# Patient Record
Sex: Female | Born: 2005 | Race: White | Hispanic: No | Smoking: Never smoker
Health system: Southern US, Community
[De-identification: ages and names within clinical notes are randomized; demographics above are authoritative.]

---
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
## 2023-01-09 ENCOUNTER — Other Ambulatory Visit (HOSPITAL_BASED_OUTPATIENT_CLINIC_OR_DEPARTMENT_OTHER): Payer: Self-pay

## 2023-01-09 ENCOUNTER — Other Ambulatory Visit: Payer: Self-pay

## 2023-01-09 ENCOUNTER — Other Ambulatory Visit (HOSPITAL_COMMUNITY): Payer: Self-pay

## 2023-01-09 MED ORDER — PANTOPRAZOLE SODIUM 20 MG PO TBEC: 20.0000 mg | DELAYED_RELEASE_TABLET | Freq: Every day | ORAL | 1 refills | Status: DC

## 2023-01-09 MED ORDER — PHENTERMINE HCL 37.5 MG PO TABS
37.5000 mg | ORAL_TABLET | Freq: Every day | ORAL | 0 refills | Status: DC
  Filled 2023-01-09: qty 30, 30d supply, fill #0

## 2023-01-10 ENCOUNTER — Other Ambulatory Visit (HOSPITAL_COMMUNITY): Payer: Self-pay

## 2023-01-10 MED ORDER — PHENTERMINE HCL 37.5 MG PO TABS: 18.7500 mg | ORAL_TABLET | Freq: Every day | ORAL | 0 refills | Status: DC

## 2023-01-21 ENCOUNTER — Other Ambulatory Visit (HOSPITAL_COMMUNITY): Payer: Self-pay

## 2023-03-19 ENCOUNTER — Other Ambulatory Visit (HOSPITAL_COMMUNITY): Payer: Self-pay

## 2023-03-19 MED ORDER — PHENTERMINE HCL 37.5 MG PO TABS
18.7500 mg | ORAL_TABLET | Freq: Every day | ORAL | 0 refills | Status: DC
Start: 2023-03-19 — End: 2023-08-20
  Filled 2023-03-19: qty 30, 30d supply, fill #0

## 2023-03-20 ENCOUNTER — Other Ambulatory Visit (HOSPITAL_COMMUNITY): Payer: Self-pay

## 2023-03-25 ENCOUNTER — Other Ambulatory Visit (HOSPITAL_COMMUNITY): Payer: Self-pay

## 2023-04-12 ENCOUNTER — Other Ambulatory Visit: Payer: Self-pay

## 2023-04-12 ENCOUNTER — Encounter (HOSPITAL_BASED_OUTPATIENT_CLINIC_OR_DEPARTMENT_OTHER): Payer: Self-pay | Admitting: Emergency Medicine

## 2023-04-12 ENCOUNTER — Other Ambulatory Visit (HOSPITAL_COMMUNITY): Payer: Self-pay

## 2023-04-12 ENCOUNTER — Emergency Department (HOSPITAL_BASED_OUTPATIENT_CLINIC_OR_DEPARTMENT_OTHER)
Admission: EM | Admit: 2023-04-12 | Discharge: 2023-04-12 | Disposition: A | Discharge status: Home / Self Care | Attending: Emergency Medicine | Admitting: Emergency Medicine

## 2023-04-12 DIAGNOSIS — R509 Fever, unspecified: Secondary | ICD-10-CM | POA: Insufficient documentation

## 2023-04-12 DIAGNOSIS — J029 Acute pharyngitis, unspecified: Secondary | ICD-10-CM | POA: Diagnosis present

## 2023-04-12 LAB — RESP PANEL BY RT-PCR (RSV, FLU A&B, COVID)  RVPGX2
Influenza A by PCR: NEGATIVE
Influenza B by PCR: NEGATIVE
Resp Syncytial Virus by PCR: NEGATIVE
SARS Coronavirus 2 by RT PCR: NEGATIVE

## 2023-04-12 MED ORDER — AZITHROMYCIN 250 MG PO TABS
ORAL_TABLET | ORAL | 0 refills | Status: DC
Start: 2023-04-12 — End: 2023-04-12
  Filled 2023-04-12: qty 6, 5d supply, fill #0

## 2023-04-12 MED ORDER — PSEUDOEPH-BROMPHEN-DM 30-2-10 MG/5ML PO SYRP
ORAL_SOLUTION | ORAL | 0 refills | Status: DC
  Filled 2023-04-12: qty 240, 4d supply, fill #0

## 2023-04-12 MED ORDER — DEXAMETHASONE SODIUM PHOSPHATE 10 MG/ML IJ SOLN
10.0000 mg | Freq: Once | INTRAMUSCULAR | Status: AC
Start: 2023-04-12 — End: 2023-04-12
  Administered 2023-04-12: 10 mg via INTRAMUSCULAR
  Filled 2023-04-12: qty 1

## 2023-04-12 MED ORDER — PREDNISONE 20 MG PO TABS
ORAL_TABLET | ORAL | 0 refills | Status: DC
Start: 2023-04-12 — End: 2023-04-12
  Filled 2023-04-12: qty 5, 5d supply, fill #0

## 2023-04-12 NOTE — ED Triage Notes (Addendum)
 Fever last night , sore throat , earache , body aches , headache .  Was tested yesterday for strep and mono , negative results .

## 2023-04-12 NOTE — ED Provider Notes (Signed)
 [facility] Provider Note   CSN: [id number] Arrival date & time: 04/12/23  1620     History Chief Complaint  Patient presents with   Fever    Diane Benton is a 18 y.o. female.  Patient presents to the emergency department today with concerns of a sore throat, fever, earache and bodyaches.  She states that she was tested for strep and mono yesterday at her [facility] and was negative.  She is currently a Consulting civil engineer at [employer].  She reports that she is currently sexually active.  Denies any urinary concerns. Reports performing oral sex within the last 2 weeks with some condom use and that this partner is a "sort of newish" partner.   Fever      Home Medications Prior to Admission medications   Medication Sig Start Date End Date Taking? Authorizing Provider  escitalopram (LEXAPRO) 10 MG tablet Take 1 tablet (10 mg total) by mouth daily. 11/05/22     fluticasone (FLONASE) 50 MCG/ACT nasal spray Place 2 sprays into both nostrils daily. 11/09/22   Olive Bass, FNP  ondansetron (ZOFRAN) 4 MG tablet Take 1 tablet (4 mg total) by mouth every 8 (eight) hours as needed for nausea or vomiting. 03/06/22   Olive Bass, FNP  ondansetron (ZOFRAN) 4 MG tablet Take 1 tablet (4 mg total) by mouth every 6 (six) hours as needed. 03/07/22     ondansetron (ZOFRAN) 4 MG tablet Take 1 tablet (4 mg total) by mouth every 6 (six) hours as needed. 05/03/22     ondansetron (ZOFRAN) 4 MG tablet Take 1 tablet by mouth every 6 hours as needed. 10/24/22     pantoprazole (PROTONIX) 20 MG tablet Take 1 tablet (20 mg total) by mouth daily. 09/14/22     pantoprazole (PROTONIX) 20 MG tablet Take 1 tablet (20 mg total) by mouth daily. 01/09/23     pantoprazole (PROTONIX) 40 MG tablet Take 1 tablet (40 mg total) by mouth daily. 05/25/22   Olive Bass, FNP  phentermine (ADIPEX-P) 37.5 MG tablet Take 1/2-1 tablet by mouth once daily  01/09/23     phentermine (ADIPEX-P) 37.5 MG tablet Take 0.5-1 tablets (18.75-37.5 mg total) by mouth daily. 01/10/23     phentermine (ADIPEX-P) 37.5 MG tablet Take 0.5-1 tablets (18.75-37.5 mg total) by mouth daily. 03/19/23     tirzepatide (MOUNJARO) 15 MG/0.5ML Pen Inject 15mg  under the skin once weekly. (Self pay) 30 days 06/20/22     tirzepatide (MOUNJARO) 15 MG/0.5ML Pen Inject 15 mg into the skin once a week. 07/11/22     tirzepatide (MOUNJARO) 7.5 MG/0.5ML Pen Inject 7.5 mg into the skin once a week. 02/19/22     tirzepatide (ZEPBOUND) 15 MG/0.5ML Pen Inject 15 mg into the skin once a week. 07/11/22     tirzepatide (ZEPBOUND) 15 MG/0.5ML Pen Inject 15 mg into the skin once a week. 10/09/22     Tirzepatide-Weight Management (ZEPBOUND) 2.5 MG/0.5ML SOAJ Inject 2.5 mg into the skin once a week. 12/05/21   Helane Rima, DO      Allergies    Patient has no known allergies.    Review of Systems   Review of Systems  Constitutional:  Positive for fever.  All other systems reviewed and are negative.   Physical Exam Updated Vital Signs BP 118/85 (BP Location: Left Arm)   Pulse 76   Temp 98.3 F (36.8 C)   Resp 16   Wt 57.2  kg   LMP 04/06/2023 (Exact Date)   SpO2 100%   BMI 21.63 kg/m  Physical Exam Vitals and nursing note reviewed.  Constitutional:      General: She is not in acute distress.    Appearance: She is well-developed.  HENT:     Head: Normocephalic and atraumatic.     Mouth/Throat:     Pharynx: Uvula midline. Posterior oropharyngeal erythema present. No uvula swelling.     Tonsils: Tonsillar exudate present. No tonsillar abscesses. 2+ on the right. 1+ on the left.  Eyes:     Conjunctiva/sclera: Conjunctivae normal.  Cardiovascular:     Rate and Rhythm: Normal rate and regular rhythm.     Heart sounds: No murmur heard. Pulmonary:     Effort: Pulmonary effort is normal. No respiratory distress.     Breath sounds: Normal breath sounds.  Abdominal:     Palpations:  Abdomen is soft.     Tenderness: There is no abdominal tenderness.  Musculoskeletal:        General: No swelling.     Cervical back: Neck supple.  Skin:    General: Skin is warm and dry.     Capillary Refill: Capillary refill takes less than 2 seconds.  Neurological:     Mental Status: She is alert.  Psychiatric:        Mood and Affect: Mood normal.     ED Results / Procedures / Treatments   Labs (all labs ordered are listed, but only abnormal results are displayed) Labs Reviewed  RESP PANEL BY RT-PCR (RSV, FLU A&B, COVID)  [id number]  GC/CHLAMYDIA PROBE AMP ([place]) NOT AT [facility]    EKG None  Radiology No results found.  Procedures Procedures    Medications Ordered in ED Medications  dexamethasone (DECADRON) injection 10 mg (10 mg Intramuscular Given 04/12/23 1748)    ED Course/ Medical Decision Making/ A&P                                 Medical Decision Making Risk Prescription drug management.   This patient presents to the ED for concern of sore throat, fever. Differential diagnosis includes strep pharyngitis, STI pharyngitis, viral pharyngitis, viral URI   Lab Tests:  I Ordered, and personally interpreted labs.  The pertinent results include:  Respiratory panel negative, GC/chlamydia collected and pending   Medicines ordered and prescription drug management:  I ordered medication including decadron for pharyngitis  Reevaluation of the patient after these medicines showed that the patient stayed the same I have reviewed the patients home medicines and have made adjustments as needed   Problem List / ED Course:  Patient presents to the emergency department today with concerns of a sore throat, fever, body aches, earache and headache.  She reports this has been ongoing for several days and was seen at the [facility] yesterday with negative strep and mono testing.  She states that she received a prescription for Augmentin and has been taking  this for the last day with no improvement in symptoms.  States some difficulty with eating and drinking due to pain.  Denies any cough, congestion, chest pain or shortness of breath. Patient reports being sexually active but denies GU concerns. She does endorse performing oral sex on partner within the last 2 weeks with intermittent condom use. States that his partner is "newish". Physical reveals erythema and exudate primarily to the right tonsil. 2+. Left  tonsil erythematous but no exudate seen. 1+. Respiratory ordered. Respiratory panel negative. With negative strep and mono testing yesterday, suspect this still may be a strep pharyngitis but oral sex complicates picture for possible gonococcal pharyngitis. Will add on gonorrhea/chlamydia testing with oropharynx swab.  One-time dose of Decadron 10 mg added on for symptom control.  Advise continued use of Augmentin for treatment of her pharyngitis.  Advised patient that if abnormal results are found on gonorrhea/chlamydia testing, she will be notified with instructions for modified treatment.  Patient verbalized understanding and agreement with this current plan.  Return precautions discussed and patient verbalized agreement.  Discharged home in stable condition.  Final Clinical Impression(s) / ED Diagnoses Final diagnoses:  Pharyngitis, unspecified etiology    Rx / DC Orders ED Discharge Orders     None         Smitty Knudsen, PA-C 04/12/23 1811    Vanetta Mulders, MD 04/12/23 2322

## 2023-04-12 NOTE — Discharge Instructions (Addendum)
 You were seen in the ER today for concerns of a sore throat and fevers. You tested negative for influenza, COVID, and RSV. With your negative strep and mono test yesterday, I am not sure what is causing your sore throat. You had additional testing for gonococcal pharyngitis added on. This results will be available in the next few days. In the meantime, continue taking your amoxicillin. You received a one time dose of Decadron here in the ER to help with some of the sore throat. If you are worried your symptoms are worsening or new symptoms arise, please return to the ER for further evaluation.

## 2023-04-15 LAB — GC/CHLAMYDIA PROBE AMP (~~LOC~~) NOT AT ARMC
Chlamydia: NEGATIVE
Comment: NEGATIVE
Comment: NORMAL
Neisseria Gonorrhea: NEGATIVE

## 2023-05-28 ENCOUNTER — Other Ambulatory Visit (HOSPITAL_COMMUNITY): Payer: Self-pay

## 2023-05-29 ENCOUNTER — Other Ambulatory Visit (HOSPITAL_COMMUNITY): Payer: Self-pay

## 2023-07-24 ENCOUNTER — Other Ambulatory Visit (HOSPITAL_COMMUNITY): Payer: Self-pay

## 2023-07-24 MED ORDER — PANTOPRAZOLE SODIUM 20 MG PO TBEC
20.0000 mg | DELAYED_RELEASE_TABLET | Freq: Every day | ORAL | 1 refills | Status: AC
  Filled 2023-07-24 – 2023-08-19 (×2): qty 90, 90d supply, fill #0
  Filled 2023-12-31: qty 90, 90d supply, fill #1

## 2023-07-24 MED ORDER — PHENTERMINE HCL 37.5 MG PO TABS
ORAL_TABLET | ORAL | 0 refills | Status: DC
  Filled 2023-07-24 – 2023-08-19 (×2): qty 30, 30d supply, fill #0

## 2023-08-05 ENCOUNTER — Other Ambulatory Visit (HOSPITAL_COMMUNITY): Payer: Self-pay

## 2023-08-19 ENCOUNTER — Other Ambulatory Visit: Payer: Self-pay

## 2023-08-19 ENCOUNTER — Other Ambulatory Visit (HOSPITAL_COMMUNITY): Payer: Self-pay

## 2023-08-20 ENCOUNTER — Encounter: Payer: Self-pay | Admitting: Family

## 2023-08-20 ENCOUNTER — Other Ambulatory Visit (HOSPITAL_BASED_OUTPATIENT_CLINIC_OR_DEPARTMENT_OTHER): Payer: Self-pay

## 2023-08-20 ENCOUNTER — Other Ambulatory Visit (HOSPITAL_COMMUNITY): Payer: Self-pay

## 2023-08-20 ENCOUNTER — Ambulatory Visit (INDEPENDENT_AMBULATORY_CARE_PROVIDER_SITE_OTHER): Admitting: Family

## 2023-08-20 VITALS — BP 102/64 | HR 85 | Ht 64.0 in | Wt 137.4 lb

## 2023-08-20 DIAGNOSIS — L700 Acne vulgaris: Secondary | ICD-10-CM | POA: Diagnosis not present

## 2023-08-20 MED ORDER — DOXYCYCLINE HYCLATE 100 MG PO TABS
100.0000 mg | ORAL_TABLET | Freq: Two times a day (BID) | ORAL | 0 refills | Status: DC
  Filled 2023-08-20: qty 14, 7d supply, fill #0

## 2023-08-20 NOTE — Progress Notes (Signed)
  Diane Benton is a 18 y.o. female with the following history as recorded in EpicCare:  There are no active problems to display for this patient.   Current Outpatient Medications  Medication Sig Dispense Refill   doxycycline  (VIBRA -TABS) 100 MG tablet Take 1 tablet (100 mg total) by mouth 2 (two) times daily. 14 tablet 0   escitalopram  (LEXAPRO ) 10 MG tablet Take 1 tablet (10 mg total) by mouth daily. 90 tablet 1   ondansetron  (ZOFRAN ) 4 MG tablet Take 1 tablet by mouth every 6 hours as needed. 20 tablet 3   pantoprazole  (PROTONIX ) 20 MG tablet Take 1 tablet by mouth once daily. 90 tablet 1   phentermine  (ADIPEX-P ) 37.5 MG tablet Take 1/2-1 tablet by mouth once daily 30 tablet 0   tirzepatide  (MOUNJARO ) 15 MG/0.5ML Pen Inject 15 mg into the skin once a week. 2 mL 11   No current facility-administered medications for this visit.    Allergies: Patient has no known allergies.  No past medical history on file.  No past surgical history on file.  Family History  Problem Relation Age of Onset   Cancer Father     Social History   Tobacco Use   Smoking status: Never    Passive exposure: Never   Smokeless tobacco: Never  Substance Use Topics   Alcohol use: Not Currently    Comment: pt is 18yo    Subjective:   Accompanied by mother; worried about cystic acne that has been present for months; is scheduled to see dermatology later this month; notes that pain was so noticeable yesterday she wanted to see if there was a short term option for her.     Objective:  Vitals:   08/20/23 0823  BP: 102/64  Pulse: 85  SpO2: 97%  Weight: 137 lb 6.4 oz (62.3 kg)  Height: 5' 4 (1.626 m)    General: Well developed, well nourished, in no acute distress  Skin : Warm and dry. Cystic acne noted on chin;  Head: Normocephalic and atraumatic  Eyes: Sclera and conjunctiva clear; pupils round and reactive to light; extraocular movements intact  Ears: External normal; canals clear; tympanic  membranes normal  Oropharynx: Pink, supple. No suspicious lesions  Neck: Supple without thyromegaly, adenopathy  Lungs: Respirations unlabored;  Neurologic: Alert and oriented; speech intact; face symmetrical; moves all extremities well; CNII-XII intact without focal deficit   Assessment:  1. Cystic acne     Plan:  Rx for Doxycycline  100 mg bid x 7 days; keep planned follow up with dermatology for later this month to discuss treatment options;  She is also aware that she will need to continue with new provider at [facility] for weight loss medication management.    No follow-ups on file.  No orders of the defined types were placed in this encounter.   Requested Prescriptions   Signed Prescriptions Disp Refills   doxycycline  (VIBRA -TABS) 100 MG tablet 14 tablet 0    Sig: Take 1 tablet (100 mg total) by mouth 2 (two) times daily.

## 2023-08-20 NOTE — Patient Instructions (Signed)
 Our office is not equipped to be a weight loss management center- please follow up with the new provider at [facility] for continuation of care.  With cystic acne, you would need to see a dermatologist. We will start Doxycycline  for you to help now but dermatologists have some options that we cannot manage in primary care.

## 2023-08-23 ENCOUNTER — Other Ambulatory Visit (HOSPITAL_COMMUNITY): Payer: Self-pay

## 2023-08-23 MED ORDER — DOXYCYCLINE HYCLATE 100 MG PO TABS
100.0000 mg | ORAL_TABLET | Freq: Two times a day (BID) | ORAL | 3 refills | Status: AC
  Filled 2023-08-23: qty 60, 30d supply, fill #0

## 2023-09-01 ENCOUNTER — Other Ambulatory Visit (HOSPITAL_COMMUNITY): Payer: Self-pay

## 2023-09-05 ENCOUNTER — Other Ambulatory Visit (HOSPITAL_COMMUNITY): Payer: Self-pay

## 2023-09-05 MED ORDER — PHENTERMINE HCL 37.5 MG PO TABS
18.7500 mg | ORAL_TABLET | Freq: Every day | ORAL | 0 refills | Status: DC
  Filled 2023-09-05: qty 30, 30d supply, fill #0

## 2023-09-06 ENCOUNTER — Other Ambulatory Visit (HOSPITAL_COMMUNITY): Payer: Self-pay

## 2023-09-26 ENCOUNTER — Other Ambulatory Visit (HOSPITAL_COMMUNITY): Payer: Self-pay

## 2023-09-26 MED ORDER — DROSPIRENONE-ETHINYL ESTRADIOL 3-0.02 MG PO TABS
1.0000 | ORAL_TABLET | Freq: Every day | ORAL | 11 refills | Status: AC
  Filled 2023-09-26: qty 28, 28d supply, fill #0
  Filled 2023-11-06: qty 28, 28d supply, fill #1
  Filled 2023-12-03: qty 28, 28d supply, fill #2
  Filled 2023-12-31: qty 28, 28d supply, fill #3
  Filled 2024-01-30: qty 28, 28d supply, fill #4

## 2023-09-26 MED ORDER — SULFAMETHOXAZOLE-TRIMETHOPRIM 800-160 MG PO TABS
1.0000 | ORAL_TABLET | Freq: Two times a day (BID) | ORAL | 3 refills | Status: AC
  Filled 2023-09-26: qty 60, 30d supply, fill #0
  Filled 2023-11-06: qty 60, 30d supply, fill #1
  Filled 2023-12-20: qty 60, 30d supply, fill #2

## 2023-10-25 ENCOUNTER — Other Ambulatory Visit (HOSPITAL_COMMUNITY): Payer: Self-pay

## 2023-10-30 ENCOUNTER — Other Ambulatory Visit (HOSPITAL_COMMUNITY): Payer: Self-pay

## 2023-11-07 ENCOUNTER — Other Ambulatory Visit: Payer: Self-pay

## 2023-11-11 ENCOUNTER — Other Ambulatory Visit (HOSPITAL_COMMUNITY): Payer: Self-pay

## 2023-11-11 MED ORDER — TRETINOIN 0.025 % EX CREA
TOPICAL_CREAM | CUTANEOUS | 3 refills | Status: AC
  Filled 2023-11-11: qty 45, 30d supply, fill #0

## 2023-11-11 MED ORDER — CLINDAMYCIN PHOSPHATE 1 % EX LOTN
TOPICAL_LOTION | CUTANEOUS | 11 refills | Status: AC
  Filled 2023-11-11: qty 60, 30d supply, fill #0

## 2023-11-29 ENCOUNTER — Other Ambulatory Visit (HOSPITAL_COMMUNITY): Payer: Self-pay

## 2023-11-29 ENCOUNTER — Encounter (HOSPITAL_COMMUNITY): Payer: Self-pay

## 2023-12-02 ENCOUNTER — Ambulatory Visit (INDEPENDENT_AMBULATORY_CARE_PROVIDER_SITE_OTHER): Admitting: Family Medicine

## 2023-12-02 ENCOUNTER — Encounter: Payer: Self-pay | Admitting: Family Medicine

## 2023-12-02 ENCOUNTER — Other Ambulatory Visit (HOSPITAL_COMMUNITY): Payer: Self-pay

## 2023-12-02 VITALS — BP 108/78 | HR 78 | Ht 63.0 in | Wt 134.4 lb

## 2023-12-02 DIAGNOSIS — F418 Other specified anxiety disorders: Secondary | ICD-10-CM

## 2023-12-02 DIAGNOSIS — Z6823 Body mass index (BMI) 23.0-23.9, adult: Secondary | ICD-10-CM

## 2023-12-02 DIAGNOSIS — R632 Polyphagia: Secondary | ICD-10-CM

## 2023-12-02 DIAGNOSIS — Z8639 Personal history of other endocrine, nutritional and metabolic disease: Secondary | ICD-10-CM

## 2023-12-02 DIAGNOSIS — K219 Gastro-esophageal reflux disease without esophagitis: Secondary | ICD-10-CM

## 2023-12-02 DIAGNOSIS — F411 Generalized anxiety disorder: Secondary | ICD-10-CM

## 2023-12-02 MED ORDER — PHENTERMINE HCL 37.5 MG PO TABS
18.7500 mg | ORAL_TABLET | Freq: Every day | ORAL | 0 refills | Status: AC
  Filled 2023-12-02: qty 90, 90d supply, fill #0

## 2023-12-03 NOTE — Progress Notes (Unsigned)
 1. Polyphagia    Meds ordered this encounter  Medications   phentermine  (ADIPEX-P ) 37.5 MG tablet    Sig: Take 1/2-1 tablet by mouth once daily.    Dispense:  90 tablet    Refill:  0    Assessment and Plan Assessment & Plan Attention-deficit hyperactivity disorder, predominantly inattentive type Difficulty focusing, fatigue, and fidgeting affecting academics. Phentermine  may aid focus. Differential includes ADHD and anxiety. Discussed Vyvanse benefits and need for assessment for accommodations. - Administered ADHD questionnaire. - Consider trial of Vyvanse if ADHD confirmed. - Provide documentation for accommodation requests.  Anxiety disorder and adjustment disorder Anxiety affects focus and academics. Current medications may contribute to symptoms. Discussed need for documentation for accommodations. - Provided documentation for accommodation requests. - Continue current medications.  Acne vulgaris Improved with birth control and Bactrim . Discussed Accutane benefits and side effects. Prefers current regimen. - Continue birth control and Bactrim .  Contraceptive management Current birth control aids acne management. Not satisfied but acknowledges benefits. - Continue current birth control regimen.    Geni Shutter, DO, MS, FAAFP, Dipl. [facility] [address] [place], [address] Dept: [phone] Dept Fax: [phone]  Subjective:   ***Patient is well-known to me from previous care setting and is establishing care in this system with me as PCP. Prior records reviewed when available. Chart updated today with reconciliation of problem list, medications, allergies, and relevant history. Preventive care and chronic disease status reviewed. Portions of historical chart may remain incomplete; will update on an ongoing basis as clinically indicated.  Discussed the use of AI scribe software for clinical note transcription  with the patient, who gave verbal consent to proceed.  History of Present Illness Diane Benton is a 18 year old female who presents with focus and fatigue issues potentially related to ADHD.  Cognitive impairment and fatigue - Difficulty focusing, particularly on homework assignments - Constant fatigue despite current medication regimen - Phentermine  provides some improvement in energy and focus, but persistent issues with concentration and fidgeting remain - Increased fidgeting and ongoing difficulty maintaining attention  Anxiety and test performance - Anxiety primarily related to academic testing - Denied testing accommodations twice in the past - Requires extra time for exams due to overthinking and needing to reread questions multiple times - Currently approved for time and a half on exams at her institution, which is beneficial  Sleep disturbance - Poor sleep quality attributed to heavy homework load and prolonged study time due to difficulty focusing - Insufficient sleep overall - Recognizes need to improve hydration, currently drinks more coffee than water  Dermatologic concerns - History of severe acne, currently improved with Bactrim  and birth control - Bactrim  more effective than prior doxycycline  therapy - Concerned about potential for scarring if acne worsens - Hesitant to use Accutane due to potential side effects and tendency to pick at skin  Review of Systems: Negative, with the exception of above mentioned in HPI.  Current Outpatient Medications:    drospirenone -ethinyl estradiol  (YAZ) 3-0.02 MG tablet, Take 1 tablet by mouth daily., Disp: 28 tablet, Rfl: 11   ondansetron  (ZOFRAN ) 4 MG tablet, Take 1 tablet by mouth every 6 hours as needed., Disp: 20 tablet, Rfl: 3   pantoprazole  (PROTONIX ) 20 MG tablet, Take 1 tablet by mouth once daily., Disp: 90 tablet, Rfl: 1   sulfamethoxazole -trimethoprim  (BACTRIM  DS) 800-160 MG tablet, Take 1 tablet by mouth 2 (two)  times daily with food/water, Disp: 60 tablet, Rfl: 3  tirzepatide  (MOUNJARO ) 15 MG/0.5ML Pen, Inject 15 mg into the skin once a week., Disp: 2 mL, Rfl: 11   clindamycin  (CLEOCIN  T) 1 % lotion, Apply topically to face every morning (Patient not taking: Reported on 12/02/2023), Disp: 60 mL, Rfl: 11   doxycycline  (VIBRA -TABS) 100 MG tablet, Take 1 tablet (100 mg total) by mouth 2 (two) times daily with food and water.  **WARNING: SUN SENSITIVITY.** (Patient not taking: Reported on 12/02/2023), Disp: 60 tablet, Rfl: 3   escitalopram  (LEXAPRO ) 10 MG tablet, Take 1 tablet (10 mg total) by mouth daily. (Patient not taking: Reported on 12/02/2023), Disp: 90 tablet, Rfl: 1   phentermine  (ADIPEX-P ) 37.5 MG tablet, Take 1/2-1 tablet by mouth once daily., Disp: 90 tablet, Rfl: 0   tretinoin  (RETIN-A ) 0.025 % cream, Apply topically to affected area at bedtime (Patient not taking: Reported on 12/02/2023), Disp: 45 g, Rfl: 3   Objective:   BP 108/78 (BP Location: Right Arm, Cuff Size: Normal)   Pulse 78   Ht 5' 3 (1.6 m)   Wt 134 lb 6.4 oz (61 kg)   LMP 11/09/2023 (Exact Date)   SpO2 98%   BMI 23.81 kg/m   Wt Readings from Last 3 Encounters:  12/02/23 134 lb 6.4 oz (61 kg)  08/20/23 137 lb 6.4 oz (62.3 kg)  04/12/23 126 lb (57.2 kg) (46%, Z= -0.11)*   * Growth percentiles are based on CDC (Girls, 2-20 Years) data.    Physical Exam No results found for: CREATININE, BUN, NA, K, CL, CO2 No results found for: ALT, AST, GGT, ALKPHOS, BILITOT No results found for: HGBA1C No results found for: INSULIN No results found for: TSH No results found for: CHOL, HDL, LDLCALC, LDLDIRECT, TRIG, CHOLHDL No results found for: CI74NY No results found for: WBC, HGB, HCT, MCV, PLT No results found for: IRON, TIBC, FERRITIN

## 2023-12-08 DIAGNOSIS — Z8639 Personal history of other endocrine, nutritional and metabolic disease: Secondary | ICD-10-CM | POA: Insufficient documentation

## 2023-12-08 DIAGNOSIS — F418 Other specified anxiety disorders: Secondary | ICD-10-CM | POA: Insufficient documentation

## 2023-12-08 DIAGNOSIS — F411 Generalized anxiety disorder: Secondary | ICD-10-CM | POA: Insufficient documentation

## 2023-12-08 DIAGNOSIS — K219 Gastro-esophageal reflux disease without esophagitis: Secondary | ICD-10-CM | POA: Insufficient documentation

## 2023-12-08 DIAGNOSIS — R632 Polyphagia: Secondary | ICD-10-CM | POA: Insufficient documentation

## 2023-12-08 DIAGNOSIS — Z6823 Body mass index (BMI) 23.0-23.9, adult: Secondary | ICD-10-CM | POA: Insufficient documentation

## 2023-12-31 ENCOUNTER — Other Ambulatory Visit: Payer: Self-pay

## 2023-12-31 ENCOUNTER — Other Ambulatory Visit (HOSPITAL_COMMUNITY): Payer: Self-pay

## 2024-01-30 ENCOUNTER — Other Ambulatory Visit (HOSPITAL_COMMUNITY): Payer: Self-pay
